# Patient Record
Sex: Female | Born: 1935 | Race: White | Hispanic: No
Health system: Southern US, Community
[De-identification: ages and names within clinical notes are randomized; demographics above are authoritative.]

---

## 2008-11-15 ENCOUNTER — Encounter: Payer: Self-pay | Admitting: Internal Medicine

## 2008-11-15 ENCOUNTER — Inpatient Hospital Stay (HOSPITAL_COMMUNITY): Admission: EM | Admit: 2008-11-15 | Discharge: 2008-11-17 | Payer: Self-pay | Admitting: Emergency Medicine

## 2008-11-15 ENCOUNTER — Ambulatory Visit: Payer: Self-pay | Admitting: Internal Medicine

## 2008-11-15 ENCOUNTER — Ambulatory Visit: Payer: Self-pay | Admitting: Cardiovascular Disease

## 2008-12-07 ENCOUNTER — Encounter: Admission: RE | Admit: 2008-12-07 | Discharge: 2008-12-07 | Payer: Self-pay | Admitting: Neurology

## 2009-01-13 DEATH — deceased

## 2010-05-07 ENCOUNTER — Encounter: Payer: Self-pay | Admitting: Neurology

## 2010-07-22 LAB — COMPREHENSIVE METABOLIC PANEL
Albumin: 3.2 g/dL — ABNORMAL LOW (ref 3.5–5.2)
BUN: 9 mg/dL (ref 6–23)
Calcium: 8.5 mg/dL (ref 8.4–10.5)
Creatinine, Ser: 0.9 mg/dL (ref 0.4–1.2)
Potassium: 3.6 mEq/L (ref 3.5–5.1)
Total Protein: 6.5 g/dL (ref 6.0–8.3)

## 2010-07-22 LAB — DIFFERENTIAL
Basophils Absolute: 0.1 10*3/uL (ref 0.0–0.1)
Eosinophils Relative: 3 % (ref 0–5)
Monocytes Absolute: 0.7 10*3/uL (ref 0.1–1.0)
Monocytes Relative: 8 % (ref 3–12)
Neutrophils Relative %: 74 % (ref 43–77)
Smear Review: INCREASED

## 2010-07-22 LAB — CK TOTAL AND CKMB (NOT AT ARMC)
CK, MB: 2 ng/mL (ref 0.3–4.0)
Relative Index: INVALID (ref 0.0–2.5)
Total CK: 54 U/L (ref 7–177)

## 2010-07-22 LAB — CULTURE, BLOOD (ROUTINE X 2): Culture: NO GROWTH

## 2010-07-22 LAB — URINALYSIS, ROUTINE W REFLEX MICROSCOPIC
Glucose, UA: NEGATIVE mg/dL
Hgb urine dipstick: NEGATIVE
Specific Gravity, Urine: 1.009 (ref 1.005–1.030)

## 2010-07-22 LAB — CARDIAC PANEL(CRET KIN+CKTOT+MB+TROPI)
CK, MB: 2.9 ng/mL (ref 0.3–4.0)
Relative Index: INVALID (ref 0.0–2.5)
Total CK: 56 U/L (ref 7–177)
Total CK: 59 U/L (ref 7–177)
Troponin I: 0.07 ng/mL — ABNORMAL HIGH (ref 0.00–0.06)

## 2010-07-22 LAB — BASIC METABOLIC PANEL
BUN: 11 mg/dL (ref 6–23)
CO2: 24 mEq/L (ref 19–32)
Chloride: 107 mEq/L (ref 96–112)
Creatinine, Ser: 0.89 mg/dL (ref 0.4–1.2)
GFR calc Af Amer: >60 mL/min (ref >60)
GFR calc non Af Amer: >60 mL/min (ref >60)
Potassium: 3.1 mEq/L — ABNORMAL LOW (ref 3.5–5.1)
Sodium: 136 mEq/L (ref 135–145)

## 2010-07-22 LAB — URINE CULTURE

## 2010-07-22 LAB — APTT: aPTT: 36 seconds (ref 24–37)

## 2010-07-22 LAB — POCT CARDIAC MARKERS: Troponin i, poc: <0.05 ng/mL (ref 0.00–0.09)

## 2010-07-22 LAB — PROTIME-INR: INR: 2.3 — ABNORMAL HIGH (ref 0.00–1.49)

## 2010-07-22 LAB — CBC
MCV: 78.5 fL (ref 78.0–100.0)
Platelets: 559 10*3/uL — ABNORMAL HIGH (ref 150–400)
WBC: 9.1 10*3/uL (ref 4.0–10.5)

## 2010-07-22 LAB — MAGNESIUM: Magnesium: 2.1 mg/dL (ref 1.5–2.5)

## 2010-07-22 LAB — TROPONIN I: Troponin I: 0.07 ng/mL — ABNORMAL HIGH (ref 0.00–0.06)

## 2010-07-22 LAB — TSH: TSH: 1.875 u[IU]/mL (ref 0.350–4.500)

## 2010-07-31 IMAGING — CT CT HEAD W/O CM
1 of 2 series · 16 of 30 positions shown, 20 images · non-contrast
Comparison: None

CLINICAL DATA: Syncope with dizziness and weakness.

CT HEAD WITHOUT CONTRAST
TECHNIQUE: Contiguous axial images were obtained from the base of
the skull through the vertex without contrast.

[Series 3: recon 2: brain · axial · 0.47mm/px · z∈[+124,+253]mm · 16 of 56 slices shown, 20 images]
[im 3/56  brain]
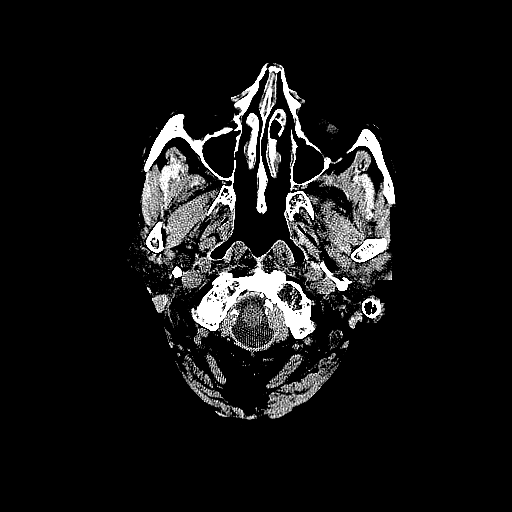
[im 3/56  bone]
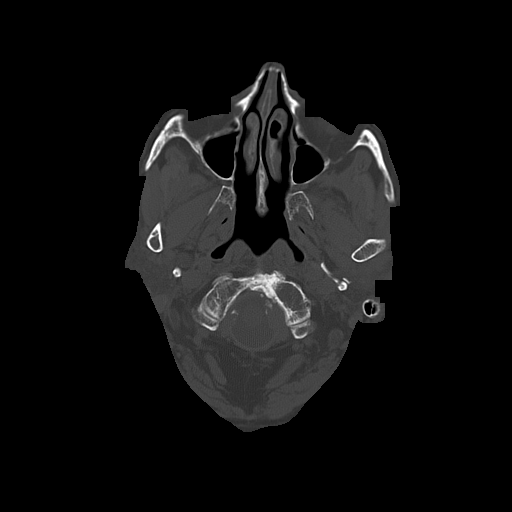
[im 6/56  brain]
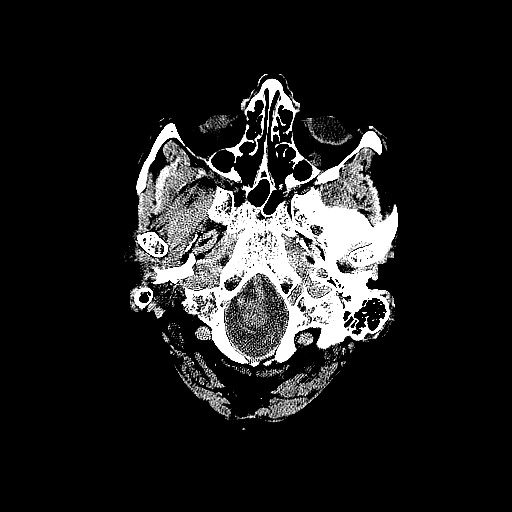
[im 9/56  brain]
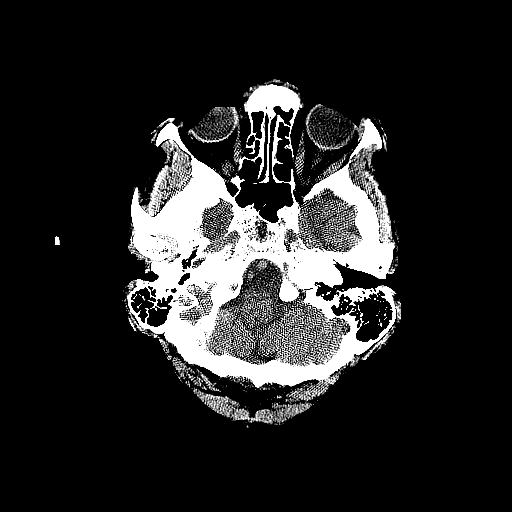
[im 12/56  brain]
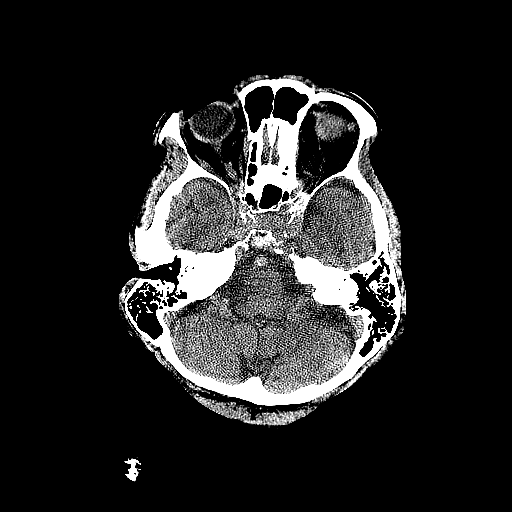
[im 18/56  brain]
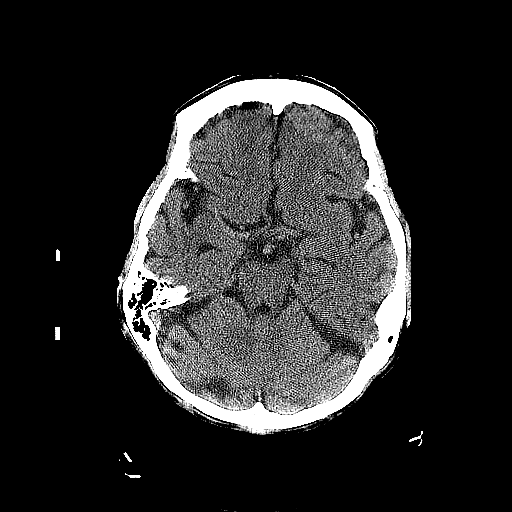
[im 18/56  bone]
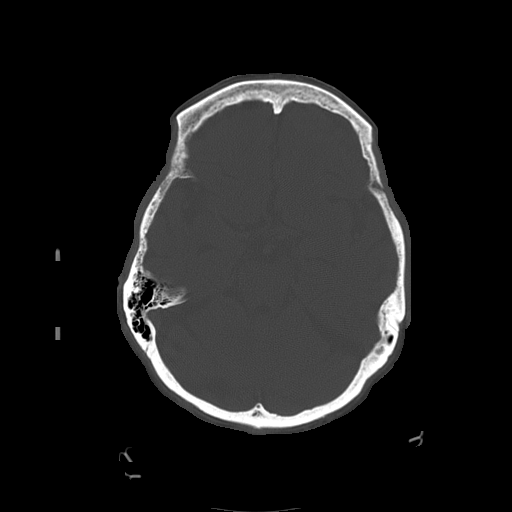
[im 21/56  brain]
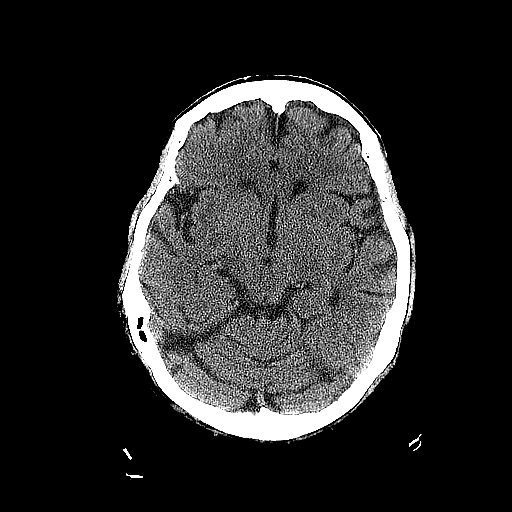
[im 24/56  brain]
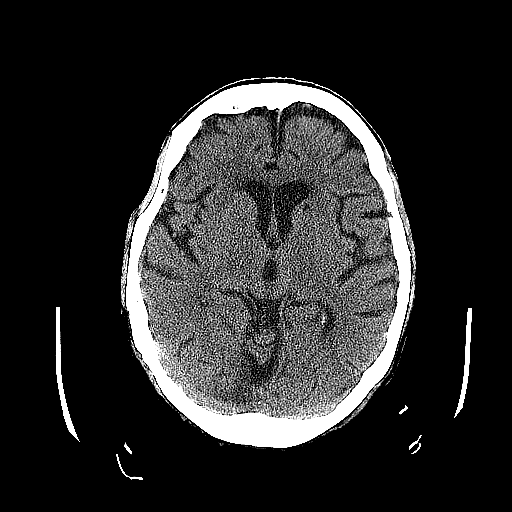
[im 27/56  brain]
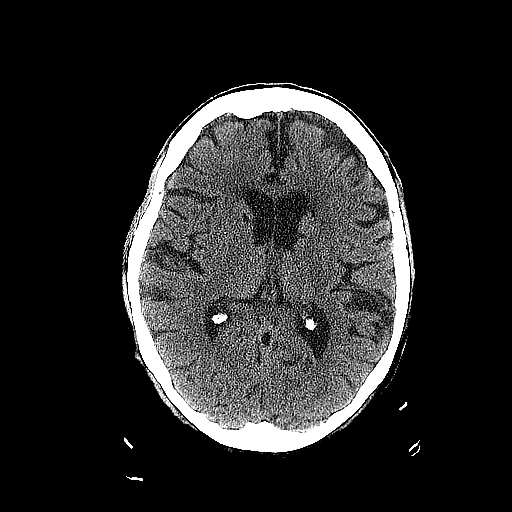
[im 29/56  brain]
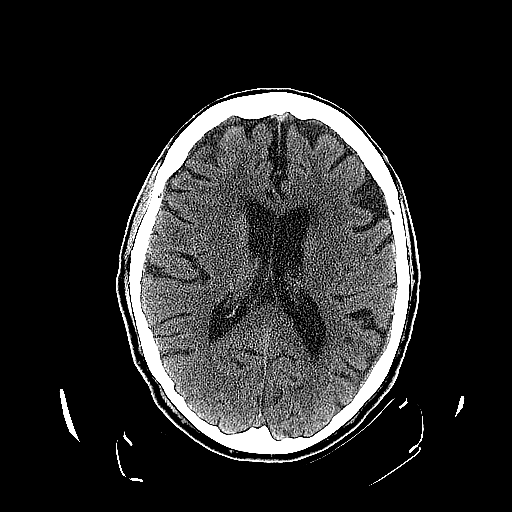
[im 29/56  bone]
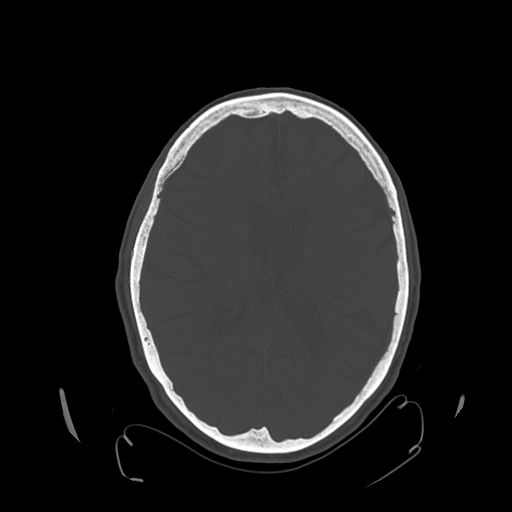
[im 32/56  brain]
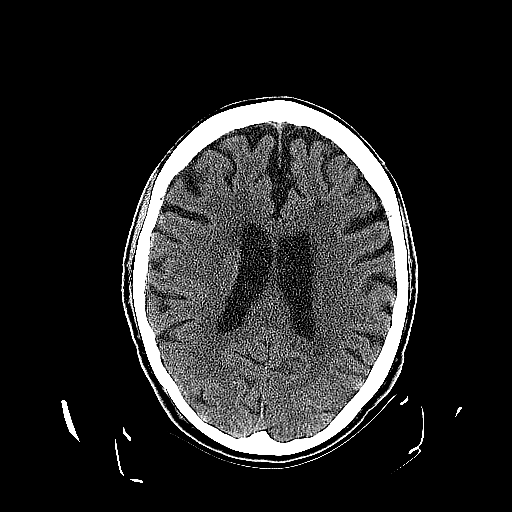
[im 35/56  brain]
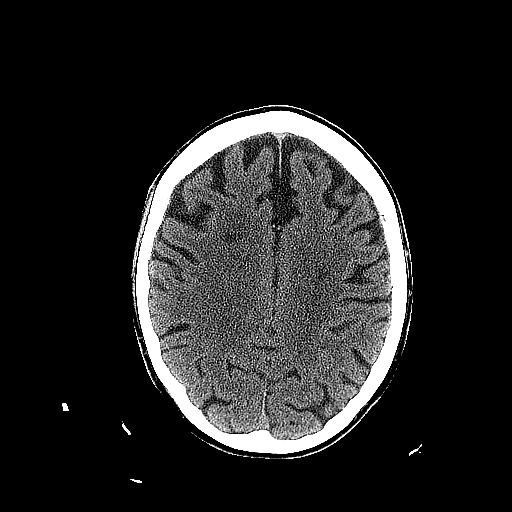
[im 38/56  brain]
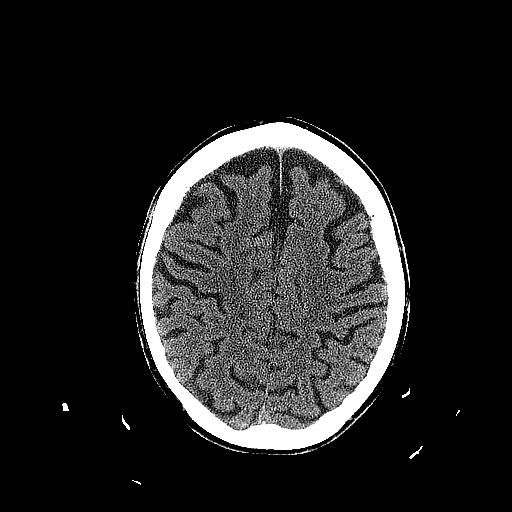
[im 44/56  brain]
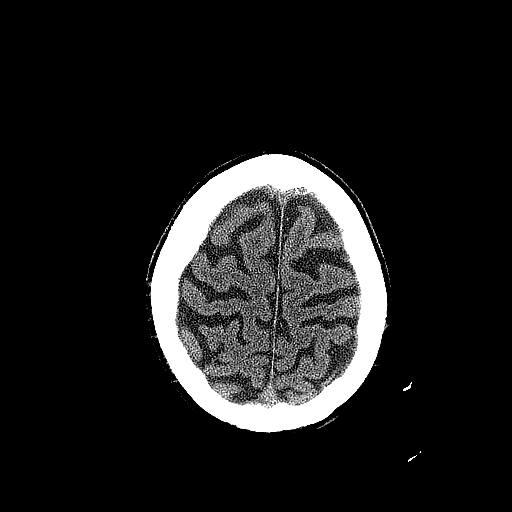
[im 44/56  bone]
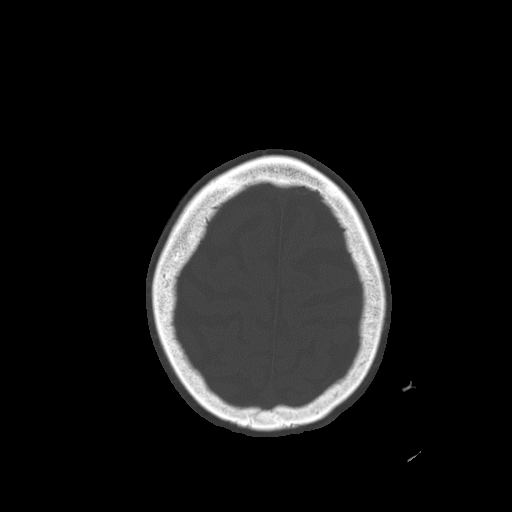
[im 47/56  brain]
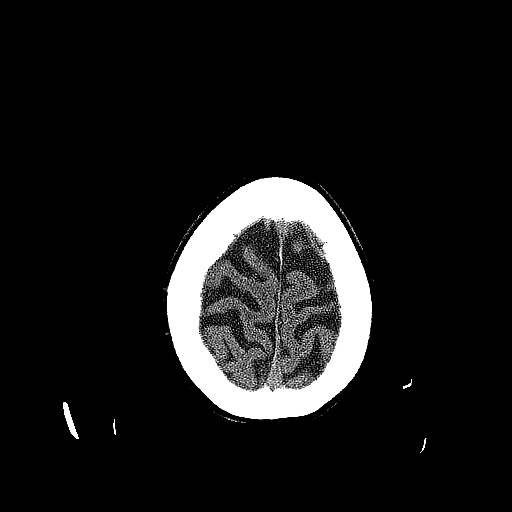
[im 50/56  brain]
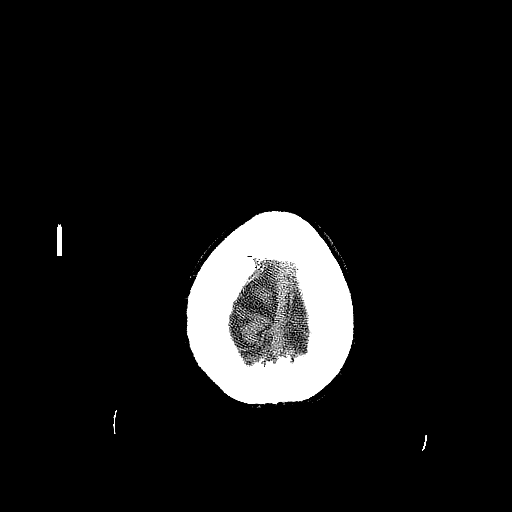
[im 53/56  brain]
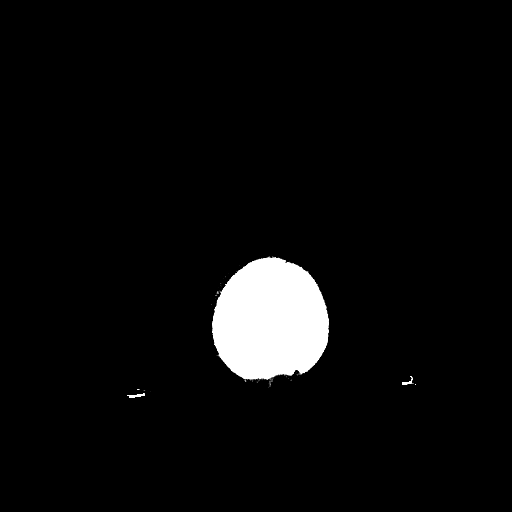

[16 of 30 positions shown; findings below may reference images not displayed]

FINDINGS: Mild generalized cerebral volume loss, mild chronic small
vessel white matter ischemic changes, and remote bilateral basal
ganglia and cerebellar infarcts noted.

No acute intracranial abnormalities are identified, including mass
lesion or mass effect, hydrocephalus, extra-axial fluid collection,
midline shift, hemorrhage, or acute infarction.  Please note that
acute infarction may be occult on CT for 24-48 hours.

The visualized bony calvarium is unremarkable.
IMPRESSION: No evidence of acute intracranial abnormality.

Mild atrophy, chronic small vessel white matter ischemic changes,
and remote infarcts as described.

## 2010-07-31 IMAGING — CR DG CHEST 2V
2 series · 2 of 2 positions shown · non-contrast
Comparison: None

CLINICAL DATA: Chest pain.

CHEST - 2 VIEW

[w chest pa]
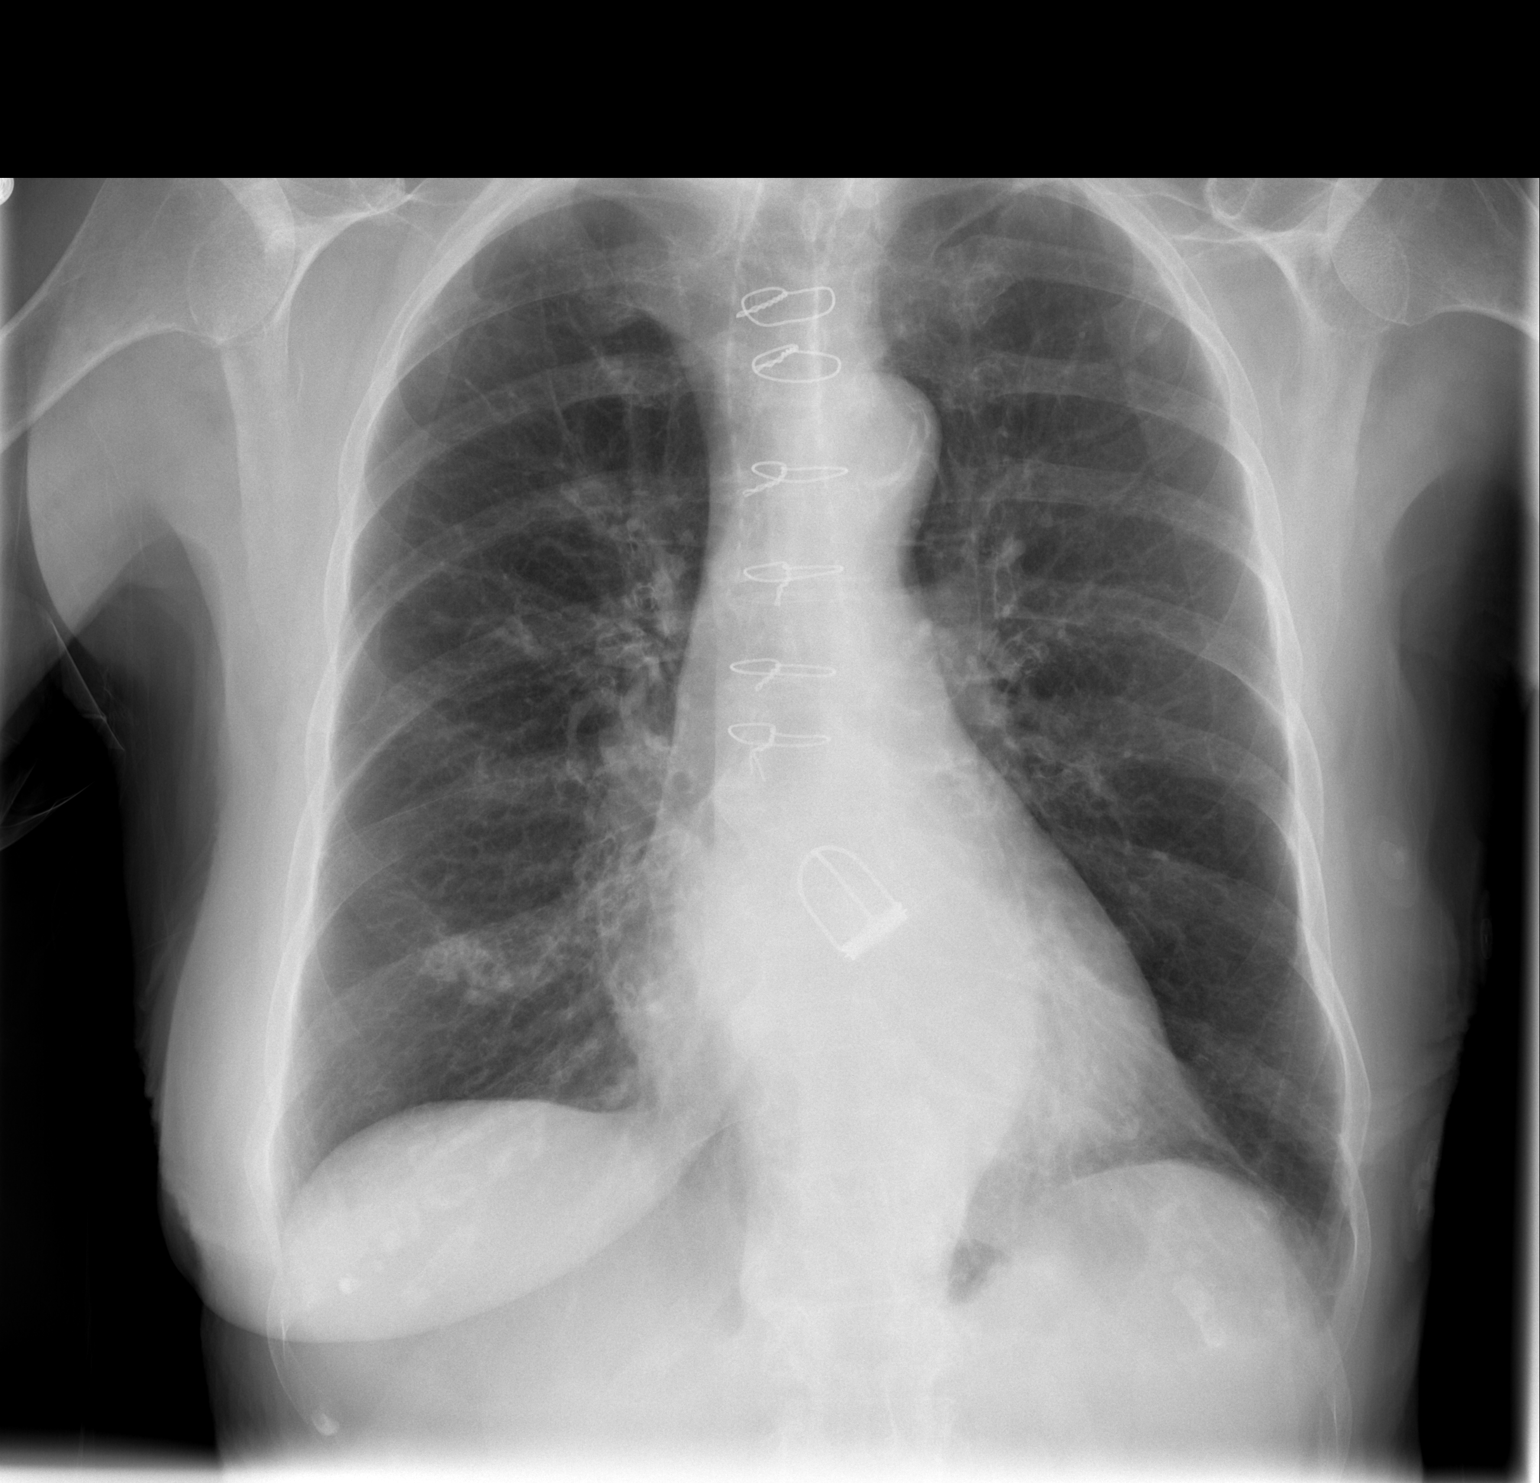

[w chest lat]
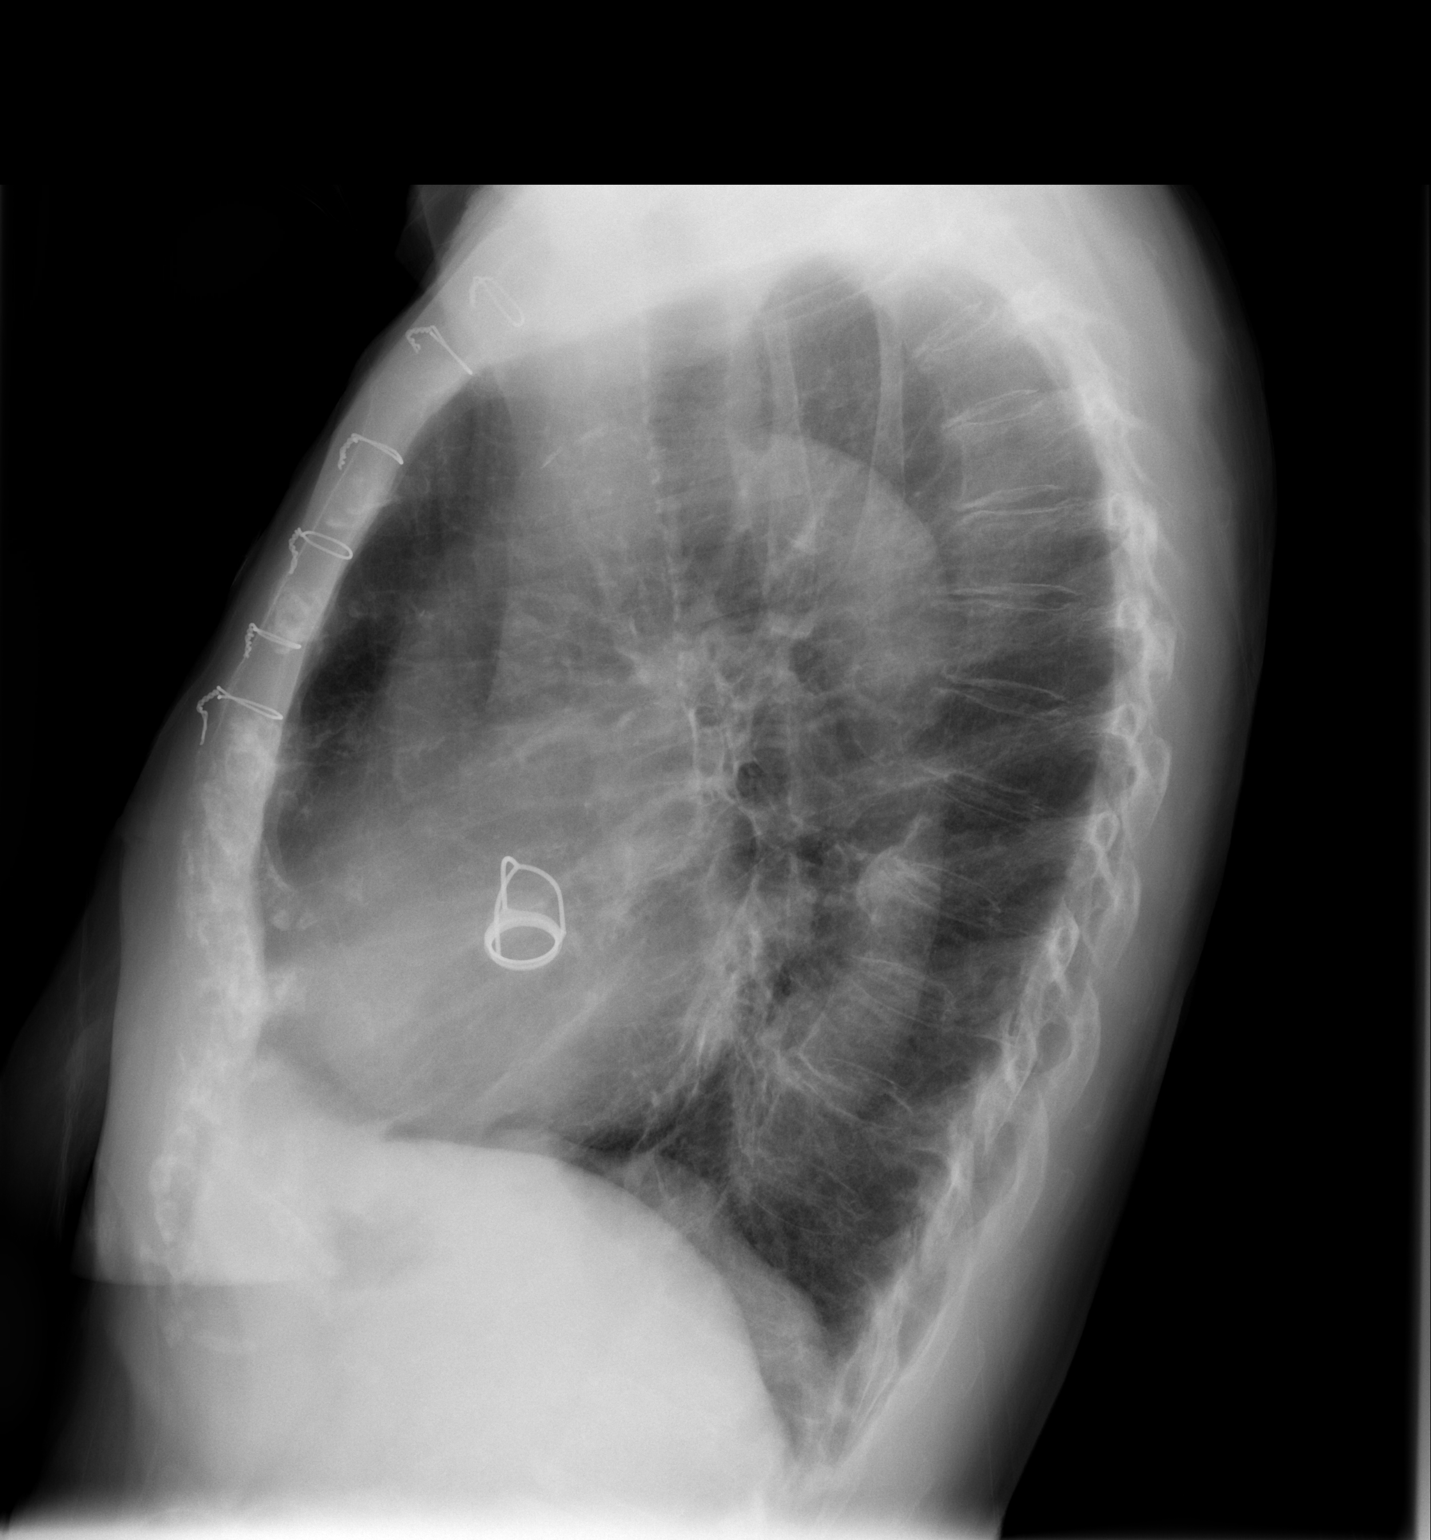

[2 of 2 positions shown; findings below may reference images not displayed]

FINDINGS: Cardiomegaly and prior cardiac valve replacement noted.
Hyperinflation is identified.
No evidence of focal airspace disease, pleural effusion,
pneumothorax, or pulmonary edema noted.
Mild peribronchial thickening is identified.
No acute bony abnormalities are noted.
IMPRESSION: Cardiomegaly without evidence of acute cardiopulmonary disease.

Bronchitic changes and hyperinflation - question COPD.

## 2010-08-28 NOTE — Discharge Summary (Signed)
Karen Wright, Karen Wright              ACCOUNT NO.:  [id number]   MEDICAL RECORD NO.:  [id number]          PATIENT TYPE:  INP   LOCATION:  3742                         FACILITY:  [facility]   PHYSICIAN:  C. Ulyess Mort, M.D.DATE OF BIRTH:  07-16-1922   DATE OF ADMISSION:  11/15/2008  DATE OF DISCHARGE:  11/17/2008                               DISCHARGE SUMMARY   PRIMARY CARE PHYSICIAN:  Dr. Jerl Mina.   OUTPATIENT CARDIOLOGIST:  Arnoldo Hooker MD.   CONSULTANTBevelyn Buckles. Bensimhon, MD   DISCHARGE DIAGNOSES:  1. Orthostatic hypertension.  2. Hypertension.  3. Status post aortic valve replacement with mechanical ball socket      valve.  4. Hypothyroidism.  5. Hypokalemia.   DISCHARGE MEDICATIONS:  1. Omeprazole 20 mg p.o. daily.  2. Coumadin 5 mg p.o. daily Monday, Wednesday, and Friday; 2.5 mg p.o.      Tuesday, Thursday, Saturday, and Sunday.  3. Folic acid 1 mg p.o. daily.  4. Synthroid 75 mcg p.o. daily.  5. Vitamin D 50,000 international units 1 tablet weekly.   DISPOSITION:  [name] was discharge in improved condition.  On the day  of discharge, she denied any additional presyncopal events, she was able  to walk well with nurse's assistance and denied any dizziness.  She will  followup with her primary care physician Dr. Burnett Sheng on November 25, 2008.  At that visit, she will need to have her blood pressure monitored as we  made changes in her blood pressure medicines.  She will also have to  have her Coumadin, her INR monitored as she was borderline  subtherapeutic on her INR during this hospitalization, and she should  also have her basic metabolic panel followed as she had hypokalemia  during the hospitalization.  She will follow up with Dr. [name], her  outpatient cardiologist, on December 07, 2008.   PROCEDURE PERFORMED:  Transthoracic 2-D echocardiogram on November 15, 2008, which showed a ventricular ejection fraction of 55-60% and  prosthetic ball socket aortic  valve with an increased systolic aortic  valve gradient and mildly dilated left atrial and no PFO.   CONSULTATION:  Bevelyn Buckles. Bensimhon, MD in Cardiology.   BRIEF HISTORY OF PRESENT ILLNESS:  This is a 77 year old woman with the  past medical history of hypothyroidism, hypertension, aortic valve  replacement, who presented with lightheadedness, and feeling that would  pass out.  This started 5 days prior to admission.  She had 1 fall  related to this, did not strike her head, did not lose consciousness,  and she was feeling lightheadedness especially upon standing and is  occasionally associated with using the bathroom.  She presented to ED  after her fall and continued weakness.  She denies chest pain, dyspnea,  nausea, or vomiting, or shortness of breath, but does admit that she has  not been drinking enough water likely.   PHYSICAL EXAMINATION:  VITAL SIGNS:  On admission, temperature of 98.4,  pulse of 94, respirations of 20, blood pressure 163/101, and satting at  96% on room air.  GENERAL:  The patient was in no  acute distress.  CARDIOVASCULAR:  Was found to have a harsh systolic ejection murmur.  LUNGS:  Showed some very slight crackles in the right and the bases  bilaterally.  NEUROLOGIC:  Showed that she was awake and oriented.  Cranial nerves II  through XII were intact.  Her sensation was grossly was intact.  Her  motor and strength function 5/5 throughout.   ADMISSION LABORATORY DATA:  Urinalysis was normal.  PT of 23.5, INR 2.1,  PTT of 36.  Sodium 138, potassium 3.8, chloride 107, bicarbonate 26,  glucose 102, BUN 11, creatinine 0.89, calcium 8.8.  CBC showed a white  cell count of 9.1, hemoglobin 13.0, hematocrit 39.7, and platelet count  of 559.  Cardiac biomarkers were negative x2.  TSH was 1.875, B12 27.4.  Urine culture was negative and blood cultures at discharge showed no  growth to date.   HOSPITAL COURSE:  1. Near syncope.  The patient's near syncope was  thought to be likely      due to orthostatic hypertension, given that she had a significant      increase in pulse upon standing.  On admission given her history of      valve replacement surgery an echocardiogram was performed which did      not any significant valve abnormalities.  She was placed in      telemetry and telemetry throughout her hospitalization showed only      sinus tachycardia without any other arrhythmias.  Her neurological      exam remains nonfocal throughout her hospitalization.  She was      treated with fluid, hydration, and her blood pressure medicines      were held at the beginning of her hospitalization.  Only her      metoprolol  was restarted.  Her benazepril and her amlodipine were      not restarted prior to discharge.  2. Hypertension.  She remained hypertensive throughout her      hospitalization likely secondary to the holding of her blood      pressure medicines.  She remained hypertensive on discharge, and      was discharged only on metoprolol 50 mg ER, which was her home      dose.  Her blood pressure medicines will need to be adjusted slowly      on her outpatient followup.  3. Status post aortic valve replacement.  She was maintained on her      home Coumadin therapy.  This was maintained on her home dose of      Coumadin.  She was found to have a borderline normal postop      therapeutic INR.  Her Coumadin dose may need to be adjusted on an      outpatient basis.  4. Hypothyroidism.  Her TSH was checked and found to be normal.  She      was maintained on her home dose of Synthroid at discharge on this      medication.  5. History of anemia.  Her CBC was checked on admission and found to      be within normal limits.  Her B12 level was checked and found to be      borderline normal.  An MMA level was drawn and will need to be      followed up outpatient.  6. Hypokalemia.  On the day of discharge, she was noted to be      hypokalemic with the  potassium  of 3.1.  This was repleted with oral      potassium.  Her potassium should be checked on her outpatient      followup to ensure that she maintains potassium in the normal      range.   VITAL SIGNS ON DISCHARGE:  Temperature 98.7, pulse 96, respirations 18,  blood pressure 164/95, satting at 100% on room air.   DISCHARGE LABORATORY DATA:  Basic metabolic panel showed sodium 136,  potassium 3.1, chloride 107, bicarb 24, glucose 93, BUN 9, creatinine  0.84.  PT 25.3, INR 2.3.      Hartley Barefoot, MD  Electronically Signed      C. Ulyess Mort, M.D.  Electronically Signed    BR/MEDQ  D:  11/17/2008  T:  11/18/2008  Job:  [id number]

## 2010-08-28 NOTE — Consult Note (Signed)
NAMECARREEN, Karen Wright              ACCOUNT NO.:  [id number]   MEDICAL RECORD NO.:  [id number]          PATIENT TYPE:  INP   LOCATION:  3742                         FACILITY:  [facility]   PHYSICIAN:  Bevelyn Buckles. Bensimhon, MDDATE OF BIRTH:  1922/08/15   DATE OF CONSULTATION:  11/16/2008  DATE OF DISCHARGE:                                 CONSULTATION   PRIMARY CARE PHYSICIAN:  Dr. Jerl Mina.   CARDIOLOGIST:  Dr. [name] at [facility].   REASON FOR CONSULTATION:  Presyncope and tachy-palpitations.   HISTORY OF PRESENT ILLNESS:  Karen Wright is an 77 year old Caucasian  female with no known history of CAD, but history significant for aortic  valve replacement in 1986, hypertension, hyperlipidemia, hypothyroidism,  as well as symptomatic cholelithiasis, I believe not to be a surgical  candidate and recent IV infusions of iron.  The patient presents with 2  episodes of presyncope in the last week.  She reports being in her usual  state of health up until approximately 1 week ago when she noted  moderate-to-severe lightheadedness followed by tachy-palpitations.  One  of her episodes resulted in ending up on the floor without frank  syncope.  She was sent to [facility] for further eval and since  arrival her workup has been without an obvious etiology.  She is not  orthostatic, EKG without significant changes, sinus to sinus tach on  telemetry, but up as high as in the 140s with minimal activity, 2-D  echocardiogram with questionable aortic valve aneurysm.  Since arrival,  she has not had any recurrence of her symptoms.  Note, the patient  reports recent IV iron infusions and she notes a temporary relation with  her symptoms?   PAST MEDICAL HISTORY:  1. Aortic valve replacement in 1986 with chronic Coumadin use since.  2. Left mastectomy in 1986.  3. Thyroidectomy in 1995.  4. Laser therapy, narrow angle glaucoma, both eyes in 1999.  5. Hypertension.  6.  Hyperlipidemia.   1. Chronic diarrhea, felt secondary to IBS (negative GI workup).  2. Complex left thyroid nodule secondary to hypothyroidism.  3. Cholelithiasis, symptomatic, not believed to be a surgical      candidate in view of comorbidity conditions almost absolutely      necessary.   SOCIAL HISTORY:  The patient lives in [place] alone, although her  son lives nearby.  She is retired.  She has no significant smoking,  EtOH, illicit drug, herbal medication use or history.  She has a regular  diet and does no regular exercise, although is active around the house.   FAMILY MEDICAL HISTORY:  Noncontributory secondary to the patient's  advanced age.   REVIEW OF SYSTEMS:  Please see HPI.  Note, the patient denies any chest  pain, shortness of breath, changes to chronic mild dyspnea on exertion,  orthopnea, increase lower extremity edema, coughing/fever/chills,  bleeding/dark stools, or any other new complaints.   ALLERGIES:  NKDA.   MEDICATIONS:  1. Toprol-XL 75 mg p.o. nightly.  2. Prilosec 20 mg p.o. daily.  3. Lasix 20 mg p.o. p.r.n.  4. Synthroid  75 mcg p.o. daily.  5. Coumadin 5 mg p.o. q.p.m.  6. Lotensin 40 mg p.o. q.a.m.  7. Iron.  8. Vitamin D 50,000 units weekly.  9. Folic acid.  10.B12 shot.  11.Amlodipine 5 mg p.o. daily.   PHYSICAL EXAMINATION:  VITAL SIGNS:  Temperature 98.8 degrees  Fahrenheit.  BP on admission 163/100, down to 140/95 currently.  Pulse  94 on admission, up to 123 currently.  Respiration rate 18.  O2  saturation 94% on room air.  GENERAL:  The patient is alert and oriented x3 in no apparent distress,  able to speak and move easily without any respiratory distress.  HEENT:  Her head is normocephalic and atraumatic.  Pupils are equal,  round, and reactive to light.  Extraocular muscles are intact.  Nares  were patent without discharge.  Oropharynx without erythema or exudates.  NECK:  Supple without lymphadenopathy.  She has bilateral   bruits/radiation of likely AS murmur.  No significant JVD.  HEART:  Rate is regular with audible S1 and S2.  A 4/6 systolic murmur  heard throughout, best heard at left sternal border.  Radial pulse is 2+  and equal, dorsalis pedis pulse is 1+ and equal.  LUNGS:  Clear to auscultation bilaterally.  The patient uses accessory  muscles with deep inspiration.  SKIN:  No rashes, lesions, or petechiae.  ABDOMEN:  Soft, nontender, and nondistended.  Normal abdominal bowel  sounds.  No rebound or guarding.  No hepatosplenomegaly.  EXTREMITIES:  No clubbing, cyanosis, or edema.  MUSCULOSKELETAL:  Mild-to-moderately kyphotic spine, otherwise no joint  deformities or effusions.  No spinal or CVA tenderness.  NEURO:  Cranial nerves II-XII are grossly intact.  Strength 5/5 in all  extremities and axial groups.  Normal sensation throughout and normal  cerebellar function.   RADIOLOGY:  Chest x-ray showed cardiomegaly without evidence of acute  cardiopulmonary disease.  Bronchitic changes and hyperinflation,  question of COPD.  A CT of the head showed no evidence of acute  intracranial abnormality.   EKG showed sinus tachycardia at a rate of 102 with PVCs, asymmetric T-  wave inversion in V4-V6 and LV strain pattern, normal axis, left  ventricular hypertrophy, questionable Q-waves in V1, V2, and aVL (likely  lead placement).  PR 204, QRS 90, QTc 469.  ST-T wave changes, new from  11/14/? year, but resolved on EKG this a.m.   LABORATORY DATA:  WBC 9.1 with normal differential, HGB 13.0, HCT 39.7,  and PLT count 559.  Sodium 140, potassium 3.6, chloride 109, CO2 of 23,  BUN 9, creatinine 0.9, and glucose 115.  Liver function tests within  normal limits.  Albumin 3.2, calcium 8.5, and magnesium 2.1.  INR 2.5  and PT 26.7. Cardiac enzymes, minimal elevated troponins at 0.07, 0.07,  and 0.06, otherwise CK/MB not elevated.  Urinalysis within normal  limits.  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  No significant AR.  TEE is clinically indicated.  The left atrium mildly  dilated and septum without ASD or PFO.   ASSESSMENT AND PLAN:  Karen Wright is a very pleasant 77 year old Caucasian  female with the above-noted medical history who was seen and examined by  Dr. Gala Romney who also reviewed her echocardiogram.  He notes that he is  unsure of what caused her presyncope.  Echo with normal left ventricular  function and likely ball-gag aortic valve replacement (1986) with  moderate aortic stenosis.  On telemetry, the patient has episodes of  what appears to be sinus tachycardia up to 145 beats per minute (no  clear evidence of supraventricular tachycardia).  At this point, I would  hydrate, and start back on her home dose of beta-blocker.  We will  continue to follow her on telemetry, watch for atrial arrhythmias, doubt  she needs a TEE at this point.  We will continue to follow.  Please hold  Lotensin and Norvasc for now.   ADDENDUM:  Note, the patient had orthostatics repeated and while her BP  remains stable her heart rate went from 114 lying to 143 standing.  We  will continue to follow up.   Thank you for the consult.      Jarrett Ables, [facility]      Daniel R. Bensimhon, MD  Electronically Signed    MS/MEDQ  D:  11/16/2008  T:  11/17/2008  Job:  [id number]

## 2019-03-30 ENCOUNTER — Telehealth: Payer: Self-pay | Admitting: *Deleted

## 2019-03-30 NOTE — Telephone Encounter (Signed)
Entered in error
